# Patient Record
Sex: Female | Born: 1965 | Race: White | Hispanic: No | Marital: Married | Smoking: Former smoker
Health system: Southern US, Community
[De-identification: ages and names within clinical notes are randomized; demographics above are authoritative.]

## PROBLEM LIST (undated history)

## (undated) DIAGNOSIS — Z87442 Personal history of urinary calculi: Secondary | ICD-10-CM

## (undated) DIAGNOSIS — M1990 Unspecified osteoarthritis, unspecified site: Secondary | ICD-10-CM

## (undated) DIAGNOSIS — R011 Cardiac murmur, unspecified: Secondary | ICD-10-CM

## (undated) DIAGNOSIS — R51 Headache: Secondary | ICD-10-CM

## (undated) DIAGNOSIS — C801 Malignant (primary) neoplasm, unspecified: Secondary | ICD-10-CM

## (undated) DIAGNOSIS — R928 Other abnormal and inconclusive findings on diagnostic imaging of breast: Secondary | ICD-10-CM

## (undated) DIAGNOSIS — Q279 Congenital malformation of peripheral vascular system, unspecified: Secondary | ICD-10-CM

## (undated) HISTORY — PX: BREAST BIOPSY: SHX20

---

## 2016-10-09 ENCOUNTER — Other Ambulatory Visit: Payer: Self-pay | Admitting: Surgery

## 2016-10-09 DIAGNOSIS — N6489 Other specified disorders of breast: Secondary | ICD-10-CM

## 2016-10-11 ENCOUNTER — Ambulatory Visit
Admission: RE | Admit: 2016-10-11 | Discharge: 2016-10-11 | Disposition: A | Discharge status: Home / Self Care | Payer: BLUE CROSS/BLUE SHIELD | Source: Ambulatory Visit | Attending: Surgery | Admitting: Surgery

## 2016-10-11 DIAGNOSIS — N6489 Other specified disorders of breast: Secondary | ICD-10-CM

## 2016-10-24 ENCOUNTER — Other Ambulatory Visit: Payer: Self-pay | Admitting: General Surgery

## 2016-10-24 DIAGNOSIS — R928 Other abnormal and inconclusive findings on diagnostic imaging of breast: Secondary | ICD-10-CM

## 2016-11-01 ENCOUNTER — Other Ambulatory Visit: Payer: Self-pay | Admitting: General Surgery

## 2016-11-01 DIAGNOSIS — R928 Other abnormal and inconclusive findings on diagnostic imaging of breast: Secondary | ICD-10-CM

## 2016-11-02 ENCOUNTER — Encounter (HOSPITAL_COMMUNITY)
Admission: RE | Admit: 2016-11-02 | Discharge: 2016-11-02 | Disposition: A | Discharge status: Home / Self Care | Payer: BLUE CROSS/BLUE SHIELD | Source: Ambulatory Visit | Attending: General Surgery | Admitting: General Surgery

## 2016-11-02 ENCOUNTER — Encounter (HOSPITAL_COMMUNITY): Payer: Self-pay

## 2016-11-02 DIAGNOSIS — R928 Other abnormal and inconclusive findings on diagnostic imaging of breast: Secondary | ICD-10-CM | POA: Diagnosis present

## 2016-11-02 DIAGNOSIS — N6092 Unspecified benign mammary dysplasia of left breast: Secondary | ICD-10-CM | POA: Diagnosis not present

## 2016-11-02 DIAGNOSIS — R519 Headache, unspecified: Secondary | ICD-10-CM

## 2016-11-02 DIAGNOSIS — Z01812 Encounter for preprocedural laboratory examination: Secondary | ICD-10-CM | POA: Diagnosis present

## 2016-11-02 DIAGNOSIS — C801 Malignant (primary) neoplasm, unspecified: Secondary | ICD-10-CM

## 2016-11-02 DIAGNOSIS — R011 Cardiac murmur, unspecified: Secondary | ICD-10-CM

## 2016-11-02 DIAGNOSIS — R921 Mammographic calcification found on diagnostic imaging of breast: Secondary | ICD-10-CM | POA: Diagnosis not present

## 2016-11-02 DIAGNOSIS — Z87891 Personal history of nicotine dependence: Secondary | ICD-10-CM | POA: Diagnosis not present

## 2016-11-02 DIAGNOSIS — Z79899 Other long term (current) drug therapy: Secondary | ICD-10-CM | POA: Diagnosis not present

## 2016-11-02 HISTORY — DX: Malignant (primary) neoplasm, unspecified: C80.1

## 2016-11-02 HISTORY — DX: Headache: R51

## 2016-11-02 HISTORY — DX: Headache, unspecified: R51.9

## 2016-11-02 HISTORY — DX: Cardiac murmur, unspecified: R01.1

## 2016-11-02 HISTORY — DX: Congenital malformation of peripheral vascular system, unspecified: Q27.9

## 2016-11-02 HISTORY — DX: Unspecified osteoarthritis, unspecified site: M19.90

## 2016-11-02 HISTORY — DX: Personal history of urinary calculi: Z87.442

## 2016-11-02 LAB — CBC WITH DIFFERENTIAL/PLATELET
BASOS PCT: 0 %
Basophils Absolute: 0 10*3/uL (ref 0.0–0.1)
EOS ABS: 0.1 10*3/uL (ref 0.0–0.7)
EOS PCT: 2 %
HCT: 43.1 % (ref 36.0–46.0)
Hemoglobin: 14.6 g/dL (ref 12.0–15.0)
Lymphocytes Relative: 25 %
Lymphs Abs: 1.5 10*3/uL (ref 0.7–4.0)
MCH: 31.5 pg (ref 26.0–34.0)
MCHC: 33.9 g/dL (ref 30.0–36.0)
MCV: 93.1 fL (ref 78.0–100.0)
MONO ABS: 0.6 10*3/uL (ref 0.1–1.0)
Monocytes Relative: 10 %
Neutro Abs: 3.9 10*3/uL (ref 1.7–7.7)
Neutrophils Relative %: 63 %
PLATELETS: 266 10*3/uL (ref 150–400)
RBC: 4.63 MIL/uL (ref 3.87–5.11)
RDW: 13.5 % (ref 11.5–15.5)
WBC: 6.2 10*3/uL (ref 4.0–10.5)

## 2016-11-02 LAB — COMPREHENSIVE METABOLIC PANEL
ALBUMIN: 4.3 g/dL (ref 3.5–5.0)
ALT: 20 U/L (ref 14–54)
AST: 23 U/L (ref 15–41)
Alkaline Phosphatase: 49 U/L (ref 38–126)
Anion gap: 11 (ref 5–15)
BUN: 13 mg/dL (ref 6–20)
CHLORIDE: 106 mmol/L (ref 101–111)
CO2: 23 mmol/L (ref 22–32)
Calcium: 9.7 mg/dL (ref 8.9–10.3)
Creatinine, Ser: 0.62 mg/dL (ref 0.44–1.00)
GFR calc Af Amer: >60 mL/min (ref >60)
Glucose, Bld: 86 mg/dL (ref 65–99)
POTASSIUM: 4.5 mmol/L (ref 3.5–5.1)
SODIUM: 140 mmol/L (ref 135–145)
Total Bilirubin: 0.6 mg/dL (ref 0.3–1.2)
Total Protein: 6.8 g/dL (ref 6.5–8.1)

## 2016-11-02 MED ORDER — CHLORHEXIDINE GLUCONATE CLOTH 2 % EX PADS: 6.0000 | MEDICATED_PAD | Freq: Once | CUTANEOUS | Status: DC

## 2016-11-02 NOTE — Pre-Procedure Instructions (Signed)
    [facility]  11/02/2016      [id number] [place], [place] [address] [place] [address] Phone: [phone] Fax: [phone]    Your procedure is scheduled on Tues. Mar. 27 @ 100.  Report to [facility] Admitting at 1100 A.M.  Call this number if you have problems the morning of surgery:  [phone]   Remember:  Do not eat food or drink liquids after midnight.  Take these medicines the morning of surgery with A SIP OF WATER (none).  Stop taking any Vitamins or Herbal Medications. No Goody's, BC's, Aleve, Advil, Motrin, Ibuprofen or Fish oil.   Do not wear jewelry, make-up or nail polish.  Do not wear lotions, powders, or perfumes, or deoderant.  Do not shave 48 hours prior to surgery.   Do not bring valuables to the hospital.  [facility] is not responsible for any belongings or valuables.  Contacts, dentures or bridgework may not be worn into surgery.  Leave your suitcase in the car.  After surgery it may be brought to your room.  For patients admitted to the hospital, discharge time will be determined by your treatment team.  Patients discharged the day of surgery will not be allowed to drive home.   Special instructions:  See attached  Please read over the following fact sheets that you were given. Pain Booklet, Coughing and Deep Breathing and Surgical Site Infection Prevention

## 2016-11-02 NOTE — Progress Notes (Signed)
PCP: [facility] in [place]  Cardiologist: pt denies  EKG: pt denies past year  Stress test:pt denies ever  ECHO: pt denies ever   Cardiac Cath: pt denies ever  Chest x-ray: pt denies past year

## 2016-11-05 NOTE — H&P (Signed)
Leslie Lynch Location: [facility] Patient #: [id number] DOB: Sep 17, 1965 Married / Language: English / Race: White Female        History of Present Illness       This is a very pleasant 51 year old female. She is here with her husband to discuss abnormal mammogram of the left breast. She is referred by Dr. Jimmye Norman at the breast center of [place].      She is from [place] She had a mammogram 10 years ago and decided it was time for another. Imaging was performed in [place]. They described as a localized area of architectural distortion in the left breast, 1 to 2 o'clock position, stated this was worrisome and BI-RADS Category 4. They attempted core biopsy but were unsuccessful in getting tissue. She saw a Psychologist, sport and exercise, Dr. Autumn Messing, who referred her to the breast center of [facility].      She states that her films were all reviewed at [facility].. Image guided biopsy was performed of the left breast upper outer quadrant where there some calcifications and distortion. Pathology report showed usual ductal hyperplasia and fibrocystic change. This was felt to be discordant. She was referred by Dr. Jimmye Norman to have this area excised.        Comorbidities are minimal. Never had any surgery. 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 History of kidney stones Family history is negative for breast cancer or ovarian cancer. A grandmother had uterine cancer. She lives in [place]. She is married. Her husband is here with her today. They have 2 adult children. Denies tobacco. Takes alcohol occasionally. She is a housewife.        We spent a long time going over everything. We reviewed her outside mammograms and I can see the images from [facility]. I told her that there was a low but definite risk about upgrade to atypia or in situ cancer. Probably 10% estimated. I advised conservative excisional biopsy with  radioactive seed. She agrees.       She'll be scheduled for left breast lumpectomy with radioactive seed localization.  Her breasts are small and were going to try to keep this conservative.  She she does not want an incision in the areolar margin because she is afraid of loss of erectile function and numbness of the nipple. I think the incision will ask to be higher in the upper outer quadrant       I discussed the indications, details, techniques, and numerous risk of the surgery with her. She is aware the risk of bleeding, infection, cosmetic deformity, reoperation of cancer, nerve damage with numbness or chronic pain, and other unforeseen problems. She understands these issues well. At this time all of her questions were answered. She agrees with this plan.   Past Surgical History  No pertinent past surgical history   Diagnostic Studies History  Colonoscopy  never Mammogram  within last year Pap Smear  1-5 years ago  Allergies  No Known Allergies   Medication History  Vitamin D3 (5000UNIT Tablet, Oral) Active. Biotin (Oral) Specific strength unknown - Active. Pregnenolone Micronized Active. Fish Oil (Oral) Specific strength unknown - Active. Medications Reconciled  Social History Alcohol use  Occasional alcohol use. Caffeine use  Coffee, Tea. No drug use  Tobacco use  Former smoker.  Family History  Arthritis  Mother. Diabetes Mellitus  Father. Migraine Headache  Mother. Thyroid problems  Mother.  Pregnancy / Birth History  Age at menarche  12 years. Age of menopause  7-55 Gravida  2 Irregular periods  Maternal age  31-25 Para  2  Other Problems  Other disease, cancer, significant illness     Review of Systems General Present- Fatigue, Night Sweats and Weight Gain. Not Present- Appetite Loss, Chills, Fever and Weight Loss. Skin Not Present- Change in Wart/Mole, Dryness, Hives, Jaundice, New Lesions, Non-Healing Wounds, Rash and  Ulcer. HEENT Present- Wears glasses/contact lenses. Not Present- Earache, Hearing Loss, Hoarseness, Nose Bleed, Oral Ulcers, Ringing in the Ears, Seasonal Allergies, Sinus Pain, Sore Throat, Visual Disturbances and Yellow Eyes. Respiratory Not Present- Bloody sputum, Chronic Cough, Difficulty Breathing, Snoring and Wheezing. Cardiovascular Not Present- Chest Pain, Difficulty Breathing Lying Down, Leg Cramps, Palpitations, Rapid Heart Rate, Shortness of Breath and Swelling of Extremities. Gastrointestinal Not Present- Abdominal Pain, Bloating, Bloody Stool, Change in Bowel Habits, Chronic diarrhea, Constipation, Difficulty Swallowing, Excessive gas, Gets full quickly at meals, Hemorrhoids, Indigestion, Nausea, Rectal Pain and Vomiting. Female Genitourinary Not Present- Frequency, Nocturia, Painful Urination, Pelvic Pain and Urgency. Musculoskeletal Not Present- Back Pain, Joint Pain, Joint Stiffness, Muscle Pain, Muscle Weakness and Swelling of Extremities. Neurological Not Present- Decreased Memory, Fainting, Headaches, Numbness, Seizures, Tingling, Tremor, Trouble walking and Weakness. Psychiatric Present- Change in Sleep Pattern. Not Present- Anxiety, Bipolar, Depression, Fearful and Frequent crying. Endocrine Present- Hair Changes and Hot flashes. Not Present- Cold Intolerance, Excessive Hunger, Heat Intolerance and New Diabetes. Hematology Not Present- Blood Thinners, Easy Bruising, Excessive bleeding, Gland problems, HIV and Persistent Infections.  Vitals  Weight: 121.6 lb Height: 65in Body Surface Area: 1.6 m Body Mass Index: 20.24 kg/m  Temp.: 98.58F  Pulse: 86 (Regular)  BP: 132/70 (Sitting, Left Arm, Standard)    Physical Exam General Mental Status-Alert. General Appearance-Consistent with stated age. Hydration-Well hydrated. Voice-Normal.  Head and Neck Head-normocephalic, atraumatic with no lesions or palpable masses. Trachea-midline. Thyroid Gland  Characteristics - normal size and consistency. Note: Direct exam of hard palate and soft palate are negative. The left peritonsillar area shows some purplish discoloration but no mass. The right peritonsillar area is normal.   Eye Eyeball - Bilateral-Extraocular movements intact. Sclera/Conjunctiva - Bilateral-No scleral icterus.  Chest and Lung Exam Chest and lung exam reveals -quiet, even and easy respiratory effort with no use of accessory muscles and on auscultation, normal breath sounds, no adventitious sounds and normal vocal resonance. Inspection Chest Wall - Normal. Back - normal.  Breast Breast - Left-Symmetric, Non Tender, No Biopsy scars, no Dimpling, No Inflammation, No Lumpectomy scars, No Mastectomy scars, No Peau d' Orange. Breast - Right-Symmetric, Non Tender, No Biopsy scars, no Dimpling, No Inflammation, No Lumpectomy scars, No Mastectomy scars, No Peau d' Orange. Breast Lump-No Palpable Breast Mass. Note: Breasts are small. Slight ecchymoses upper outer quadrant left breast. Maybe a little thickening for the biopsy. No mass in either breast. No other skin change. No axillary adenopathy.   Cardiovascular Cardiovascular examination reveals -normal heart sounds, regular rate and rhythm with no murmurs and normal pedal pulses bilaterally.  Abdomen Inspection Inspection of the abdomen reveals - No Hernias. Skin - Scar - no surgical scars. Palpation/Percussion Palpation and Percussion of the abdomen reveal - Soft, Non Tender, No Rebound tenderness, No Rigidity (guarding) and No hepatosplenomegaly. Auscultation Auscultation of the abdomen reveals - Bowel sounds normal.  Neurologic Neurologic evaluation reveals -alert and oriented x 3 with no impairment of recent or remote memory. Mental Status-Normal.  Musculoskeletal Normal Exam - Left-Upper Extremity Strength Normal and Lower Extremity Strength Normal. Normal Exam - Right-Upper  Extremity  Strength Normal and Lower Extremity Strength Normal.  Lymphatic Head & Neck  General Head & Neck Lymphatics: Bilateral - Description - Normal. Axillary  General Axillary Region: Bilateral - Description - Normal. Tenderness - Non Tender. Femoral & Inguinal  Generalized Femoral & Inguinal Lymphatics: Bilateral - Description - Normal. Tenderness - Non Tender.    Assessment & Plan  ABNORMAL MAMMOGRAM OF LEFT BREAST (R92.8)   Your recent x-rays and image guided biopsies show an area of distortion in the upper outer quadrant of the left breast. The radiologist felt that this area was suspicious The image guided biopsy shows fibrocystic change and usual ductal hyperplasia, which are low risk findings The radiologist was not comfortable with this and thought that this was discordant and recommended a biopsy I have reviewed the mammograms and I agree that a biopsy is indicated Most likely this is not cancer but there may be as much as 10% chance that this could be upgraded to atypical hyperplasia or even low-grade breast cancer  After lengthy discussion, we have decided to proceed with scheduling you for a left breast lumpectomy with radioactive seed localization I have discussed the indications, techniques, and risk of this surgery in detail    [facility]. Dalbert Batman, M.D., [facility] General and Minimally invasive Surgery Breast and Colorectal Surgery Office:   [phone] Pager:   [phone]

## 2016-11-06 ENCOUNTER — Ambulatory Visit
Admission: RE | Admit: 2016-11-06 | Discharge: 2016-11-06 | Disposition: A | Discharge status: Home / Self Care | Payer: BLUE CROSS/BLUE SHIELD | Source: Ambulatory Visit | Attending: General Surgery | Admitting: General Surgery

## 2016-11-06 DIAGNOSIS — R928 Other abnormal and inconclusive findings on diagnostic imaging of breast: Secondary | ICD-10-CM

## 2016-11-07 ENCOUNTER — Ambulatory Visit (HOSPITAL_COMMUNITY)
Admission: RE | Admit: 2016-11-07 | Discharge: 2016-11-07 | Disposition: A | Discharge status: Home / Self Care | Payer: BLUE CROSS/BLUE SHIELD | Source: Ambulatory Visit | Attending: General Surgery | Admitting: General Surgery

## 2016-11-07 ENCOUNTER — Ambulatory Visit
Admission: RE | Admit: 2016-11-07 | Discharge: 2016-11-07 | Disposition: A | Discharge status: Home / Self Care | Payer: BLUE CROSS/BLUE SHIELD | Source: Ambulatory Visit | Attending: General Surgery | Admitting: General Surgery

## 2016-11-07 ENCOUNTER — Encounter (HOSPITAL_COMMUNITY): Payer: Self-pay | Admitting: *Deleted

## 2016-11-07 ENCOUNTER — Ambulatory Visit (HOSPITAL_COMMUNITY): Payer: BLUE CROSS/BLUE SHIELD | Admitting: Certified Registered Nurse Anesthetist

## 2016-11-07 ENCOUNTER — Encounter (HOSPITAL_COMMUNITY)
Admission: RE | Disposition: A | Discharge status: Home / Self Care | Payer: Self-pay | Source: Ambulatory Visit | Attending: General Surgery

## 2016-11-07 DIAGNOSIS — R928 Other abnormal and inconclusive findings on diagnostic imaging of breast: Secondary | ICD-10-CM

## 2016-11-07 DIAGNOSIS — Z79899 Other long term (current) drug therapy: Secondary | ICD-10-CM | POA: Insufficient documentation

## 2016-11-07 DIAGNOSIS — N6092 Unspecified benign mammary dysplasia of left breast: Secondary | ICD-10-CM | POA: Diagnosis not present

## 2016-11-07 DIAGNOSIS — R921 Mammographic calcification found on diagnostic imaging of breast: Secondary | ICD-10-CM | POA: Insufficient documentation

## 2016-11-07 DIAGNOSIS — Z87891 Personal history of nicotine dependence: Secondary | ICD-10-CM | POA: Insufficient documentation

## 2016-11-07 HISTORY — DX: Other abnormal and inconclusive findings on diagnostic imaging of breast: R92.8

## 2016-11-07 HISTORY — PX: BREAST EXCISIONAL BIOPSY: SUR124

## 2016-11-07 HISTORY — PX: BREAST LUMPECTOMY WITH RADIOACTIVE SEED LOCALIZATION: SHX6424

## 2016-11-07 LAB — HCG, SERUM, QUALITATIVE: Preg, Serum: NEGATIVE

## 2016-11-07 SURGERY — BREAST LUMPECTOMY WITH RADIOACTIVE SEED LOCALIZATION
Anesthesia: Monitor Anesthesia Care | Site: Breast | Laterality: Left

## 2016-11-07 MED ORDER — LIDOCAINE-EPINEPHRINE 2 %-1:100000 IJ SOLN
INTRAMUSCULAR | Status: DC | PRN
  Administered 2016-11-07: 20 mL via INTRADERMAL

## 2016-11-07 MED ORDER — FENTANYL CITRATE (PF) 100 MCG/2ML IJ SOLN
INTRAMUSCULAR | Status: DC | PRN
  Administered 2016-11-07: 25 ug via INTRAVENOUS
  Administered 2016-11-07: 50 ug via INTRAVENOUS
  Administered 2016-11-07: 25 ug via INTRAVENOUS

## 2016-11-07 MED ORDER — HYDROCODONE-ACETAMINOPHEN 5-325 MG PO TABS: 1.0000 | ORAL_TABLET | Freq: Four times a day (QID) | ORAL | 0 refills | Status: AC | PRN

## 2016-11-07 MED ORDER — OXYCODONE HCL 5 MG PO TABS: 5.0000 mg | ORAL_TABLET | Freq: Once | ORAL | Status: DC | PRN

## 2016-11-07 MED ORDER — LACTATED RINGERS IV SOLN
INTRAVENOUS | Status: DC | PRN
  Administered 2016-11-07: 14:00:00 via INTRAVENOUS

## 2016-11-07 MED ORDER — PROPOFOL 10 MG/ML IV BOLUS
INTRAVENOUS | Status: AC
  Filled 2016-11-07: qty 20

## 2016-11-07 MED ORDER — ACETAMINOPHEN 500 MG PO TABS
1000.0000 mg | ORAL_TABLET | ORAL | Status: DC
  Filled 2016-11-07: qty 2

## 2016-11-07 MED ORDER — GABAPENTIN 300 MG PO CAPS
300.0000 mg | ORAL_CAPSULE | ORAL | Status: AC
  Administered 2016-11-07: 300 mg via ORAL
  Filled 2016-11-07: qty 1

## 2016-11-07 MED ORDER — EPHEDRINE SULFATE 50 MG/ML IJ SOLN
INTRAMUSCULAR | Status: DC | PRN
  Administered 2016-11-07: 10 mg via INTRAVENOUS

## 2016-11-07 MED ORDER — PROPOFOL 10 MG/ML IV BOLUS
INTRAVENOUS | Status: DC | PRN
  Administered 2016-11-07 (×3): 20 mg via INTRAVENOUS

## 2016-11-07 MED ORDER — PHENYLEPHRINE HCL 10 MG/ML IJ SOLN
INTRAMUSCULAR | Status: DC | PRN
  Administered 2016-11-07: 120 ug via INTRAVENOUS
  Administered 2016-11-07 (×2): 80 ug via INTRAVENOUS

## 2016-11-07 MED ORDER — MIDAZOLAM HCL 2 MG/2ML IJ SOLN
INTRAMUSCULAR | Status: AC
  Filled 2016-11-07: qty 2

## 2016-11-07 MED ORDER — FENTANYL CITRATE (PF) 100 MCG/2ML IJ SOLN: 25.0000 ug | INTRAMUSCULAR | Status: DC | PRN

## 2016-11-07 MED ORDER — PROPOFOL 500 MG/50ML IV EMUL
INTRAVENOUS | Status: DC | PRN
  Administered 2016-11-07: 100 ug/kg/min via INTRAVENOUS

## 2016-11-07 MED ORDER — MIDAZOLAM HCL 5 MG/5ML IJ SOLN
INTRAMUSCULAR | Status: DC | PRN
  Administered 2016-11-07: 2 mg via INTRAVENOUS

## 2016-11-07 MED ORDER — OXYCODONE HCL 5 MG/5ML PO SOLN: 5.0000 mg | Freq: Once | ORAL | Status: DC | PRN

## 2016-11-07 MED ORDER — FENTANYL CITRATE (PF) 100 MCG/2ML IJ SOLN
INTRAMUSCULAR | Status: AC
  Filled 2016-11-07: qty 2

## 2016-11-07 MED ORDER — BUPIVACAINE HCL 0.25 % IJ SOLN
INTRAMUSCULAR | Status: DC | PRN
  Administered 2016-11-07: 30 mL

## 2016-11-07 MED ORDER — BUPIVACAINE HCL (PF) 0.25 % IJ SOLN
INTRAMUSCULAR | Status: AC
  Filled 2016-11-07: qty 30

## 2016-11-07 MED ORDER — CELECOXIB 200 MG PO CAPS
400.0000 mg | ORAL_CAPSULE | ORAL | Status: AC
  Administered 2016-11-07: 400 mg via ORAL
  Filled 2016-11-07: qty 2

## 2016-11-07 MED ORDER — ONDANSETRON HCL 4 MG/2ML IJ SOLN
INTRAMUSCULAR | Status: DC | PRN
  Administered 2016-11-07: 4 mg via INTRAVENOUS

## 2016-11-07 MED ORDER — 0.9 % SODIUM CHLORIDE (POUR BTL) OPTIME
TOPICAL | Status: DC | PRN
  Administered 2016-11-07: 1000 mL

## 2016-11-07 MED ORDER — LIDOCAINE-EPINEPHRINE 2 %-1:100000 IJ SOLN
INTRAMUSCULAR | Status: AC
Start: 2016-11-07 — End: 2016-11-07
  Filled 2016-11-07: qty 1

## 2016-11-07 MED ORDER — CEFAZOLIN SODIUM-DEXTROSE 2-4 GM/100ML-% IV SOLN
2.0000 g | INTRAVENOUS | Status: AC
  Administered 2016-11-07: 2 g via INTRAVENOUS
  Filled 2016-11-07: qty 100

## 2016-11-07 SURGICAL SUPPLY — 42 items
APPLIER CLIP 9.375 MED OPEN (MISCELLANEOUS) ×2
BINDER BREAST LRG (GAUZE/BANDAGES/DRESSINGS) IMPLANT
BINDER BREAST XLRG (GAUZE/BANDAGES/DRESSINGS) IMPLANT
BLADE SURG 15 STRL LF DISP TIS (BLADE) ×1 IMPLANT
BLADE SURG 15 STRL SS (BLADE) ×1
CANISTER SUCT 3000ML PPV (MISCELLANEOUS) ×2 IMPLANT
CHLORAPREP W/TINT 26ML (MISCELLANEOUS) ×2 IMPLANT
CLIP APPLIE 9.375 MED OPEN (MISCELLANEOUS) ×1 IMPLANT
COVER PROBE W GEL 5X96 (DRAPES) ×2 IMPLANT
COVER SURGICAL LIGHT HANDLE (MISCELLANEOUS) ×2 IMPLANT
DERMABOND ADVANCED (GAUZE/BANDAGES/DRESSINGS) ×1
DERMABOND ADVANCED .7 DNX12 (GAUZE/BANDAGES/DRESSINGS) ×1 IMPLANT
DEVICE DUBIN SPECIMEN MAMMOGRA (MISCELLANEOUS) ×2 IMPLANT
DRAPE CHEST BREAST 15X10 FENES (DRAPES) ×2 IMPLANT
DRAPE UTILITY XL STRL (DRAPES) ×2 IMPLANT
DRSG PAD ABDOMINAL 8X10 ST (GAUZE/BANDAGES/DRESSINGS) ×2 IMPLANT
ELECT CAUTERY BLADE 6.4 (BLADE) ×2 IMPLANT
ELECT REM PT RETURN 9FT ADLT (ELECTROSURGICAL) ×2
ELECTRODE REM PT RTRN 9FT ADLT (ELECTROSURGICAL) ×1 IMPLANT
GLOVE EUDERMIC 7 POWDERFREE (GLOVE) ×4 IMPLANT
GOWN STRL REUS W/ TWL LRG LVL3 (GOWN DISPOSABLE) ×1 IMPLANT
GOWN STRL REUS W/ TWL XL LVL3 (GOWN DISPOSABLE) ×1 IMPLANT
GOWN STRL REUS W/TWL LRG LVL3 (GOWN DISPOSABLE) ×1
GOWN STRL REUS W/TWL XL LVL3 (GOWN DISPOSABLE) ×1
ILLUMINATOR WAVEGUIDE N/F (MISCELLANEOUS) IMPLANT
KIT BASIN OR (CUSTOM PROCEDURE TRAY) ×2 IMPLANT
KIT MARKER MARGIN INK (KITS) ×2 IMPLANT
LIGHT WAVEGUIDE WIDE FLAT (MISCELLANEOUS) IMPLANT
NEEDLE HYPO 25GX1X1/2 BEV (NEEDLE) ×2 IMPLANT
NS IRRIG 1000ML POUR BTL (IV SOLUTION) ×2 IMPLANT
PACK SURGICAL SETUP 50X90 (CUSTOM PROCEDURE TRAY) ×2 IMPLANT
PENCIL BUTTON HOLSTER BLD 10FT (ELECTRODE) ×2 IMPLANT
SPONGE LAP 4X18 X RAY DECT (DISPOSABLE) ×2 IMPLANT
SUT MNCRL AB 4-0 PS2 18 (SUTURE) ×2 IMPLANT
SUT SILK 2 0 SH (SUTURE) ×2 IMPLANT
SUT VIC AB 3-0 SH 18 (SUTURE) ×2 IMPLANT
SYR BULB 3OZ (MISCELLANEOUS) ×2 IMPLANT
SYR CONTROL 10ML LL (SYRINGE) ×2 IMPLANT
TOWEL OR 17X24 6PK STRL BLUE (TOWEL DISPOSABLE) ×2 IMPLANT
TOWEL OR 17X26 10 PK STRL BLUE (TOWEL DISPOSABLE) ×2 IMPLANT
TUBE CONNECTING 12X1/4 (SUCTIONS) ×2 IMPLANT
YANKAUER SUCT BULB TIP NO VENT (SUCTIONS) ×2 IMPLANT

## 2016-11-07 NOTE — Transfer of Care (Signed)
Immediate Anesthesia Transfer of Care Note  Patient: Leslie Lynch  Procedure(s) Performed: Procedure(s): RADIOACTIVE SEED GUIDED LEFT BREAST LUMPECTOMY (Left)  Patient Location: PACU  Anesthesia Type:MAC  Level of Consciousness: awake, alert  and oriented  Airway & Oxygen Therapy: Patient Spontanous Breathing  Post-op Assessment: Report given to RN and Post -op Vital signs reviewed and stable  Post vital signs: Reviewed and stable  Last Vitals:  Vitals:   11/07/16 1111 11/07/16 1441  BP: 122/72   Pulse: 78   Resp: 20   Temp: 37.1 C 36.5 C    Last Pain:  Vitals:   11/07/16 1111  TempSrc: Oral         Complications: No apparent anesthesia complications

## 2016-11-07 NOTE — Interval H&P Note (Signed)
History and Physical Interval Note:  11/07/2016 11:07 AM  Leslie Lynch  has presented today for surgery, with the diagnosis of LEFT BREAST ABNORMAL MAMMOGRAM  The various methods of treatment have been discussed with the patient and family. After consideration of risks, benefits and other options for treatment, the patient has consented to  Procedure(s): LEFT BREAST LUMPECTOMY WITH RADIOACTIVE SEED LOCALIZATION (Left) as a surgical intervention .  The patient's history has been reviewed, patient examined, no change in status, stable for surgery.  I have reviewed the patient's chart and labs.  Questions were answered to the patient's satisfaction.     [name]

## 2016-11-07 NOTE — Anesthesia Preprocedure Evaluation (Signed)
Anesthesia Evaluation  Patient identified by MRN, date of birth, ID band Patient awake    Reviewed: Allergy & Precautions, NPO status , Patient's Chart, lab work & pertinent test results  History of Anesthesia Complications Negative for: history of anesthetic complications  Airway Mallampati: I  TM Distance: >3 FB Neck ROM: Full    Dental  (+) Teeth Intact   Pulmonary neg shortness of breath, neg sleep apnea, neg COPD, neg recent URI, former smoker,    breath sounds clear to auscultation       Cardiovascular negative cardio ROS   Rhythm:Regular     Neuro/Psych  Headaches, negative psych ROS   GI/Hepatic negative GI ROS, Neg liver ROS,   Endo/Other    Renal/GU negative Renal ROS     Musculoskeletal  (+) Arthritis ,   Abdominal   Peds  Hematology negative hematology ROS (+)   Anesthesia Other Findings   Reproductive/Obstetrics                             Anesthesia Physical Anesthesia Plan  ASA: II  Anesthesia Plan: MAC   Post-op Pain Management:    Induction: Intravenous  Airway Management Planned: Natural Airway, Nasal Cannula, Simple Face Mask and Mask  Additional Equipment: None  Intra-op Plan:   Post-operative Plan: Extubation in OR  Informed Consent:   Dental advisory given  Plan Discussed with: CRNA and Surgeon  Anesthesia Plan Comments:         Anesthesia Quick Evaluation

## 2016-11-07 NOTE — Discharge Instructions (Signed)
[facility] °Office Phone Number [phone] ° °BREAST BIOPSY/ PARTIAL MASTECTOMY: POST OP INSTRUCTIONS ° °Always review your discharge instruction sheet given to you by the facility where your surgery was performed. ° °IF YOU HAVE DISABILITY OR FAMILY LEAVE FORMS, YOU MUST BRING THEM TO THE OFFICE FOR PROCESSING.  DO NOT GIVE THEM TO YOUR DOCTOR. ° °1. A prescription for pain medication may be given to you upon discharge.  Take your pain medication as prescribed, if needed.  If narcotic pain medicine is not needed, then you may take acetaminophen (Tylenol) or ibuprofen (Advil) as needed. °2. Take your usually prescribed medications unless otherwise directed °3. If you need a refill on your pain medication, please contact your pharmacy.  They will contact our office to request authorization.  Prescriptions will not be filled after 5pm or on week-ends. °4. You should eat very light the first 24 hours after surgery, such as soup, crackers, pudding, etc.  Resume your normal diet the day after surgery. °5. Most patients will experience some swelling and bruising in the breast.  Ice packs and a good support bra will help.  Swelling and bruising can take several days to resolve.  °6. It is common to experience some constipation if taking pain medication after surgery.  Increasing fluid intake and taking a stool softener will usually help or prevent this problem from occurring.  A mild laxative (Milk of Magnesia or Miralax) should be taken according to package directions if there are no bowel movements after 48 hours. °7. Unless discharge instructions indicate otherwise, you may remove your bandages 24-48 hours after surgery, and you may shower at that time.  You may have steri-strips (small skin tapes) in place directly over the incision.  These strips should be left on the skin for 7-10 days.  If your surgeon used skin glue on the incision, you may shower in 24 hours.  The glue will flake off over the  next 2-3 weeks.  Any sutures or staples will be removed at the office during your follow-up visit. °8. ACTIVITIES:  You may resume regular daily activities (gradually increasing) beginning the next day.  Wearing a good support bra or sports bra minimizes pain and swelling.  You may have sexual intercourse when it is comfortable. °a. You may drive when you no longer are taking prescription pain medication, you can comfortably wear a seatbelt, and you can safely maneuver your car and apply brakes. °b. RETURN TO WORK:  ______________________________________________________________________________________ °9. You should see your doctor in the office for a follow-up appointment approximately two weeks after your surgery.  Your doctor’s nurse will typically make your follow-up appointment when she calls you with your pathology report.  Expect your pathology report 2-3 business days after your surgery.  You may call to check if you do not hear from us after three days. °10. OTHER INSTRUCTIONS: _______________________________________________________________________________________________ _____________________________________________________________________________________________________________________________________ °_____________________________________________________________________________________________________________________________________ °_____________________________________________________________________________________________________________________________________ ° °WHEN TO CALL YOUR DOCTOR: °1. Fever over 101.0 °2. Nausea and/or vomiting. °3. Extreme swelling or bruising. °4. Continued bleeding from incision. °5. Increased pain, redness, or drainage from the incision. ° °The clinic staff is available to answer your questions during regular business hours.  Please don’t hesitate to call and ask to speak to one of the nurses for clinical concerns.  If you have a medical emergency, go to the nearest  emergency room or call 911.  A surgeon from [facility] is always on call at the hospital. ° °For further questions, please visit [email or web address]  °

## 2016-11-07 NOTE — Op Note (Signed)
Patient Name:           Leslie Lynch   Date of Surgery:        11/07/2016  Pre op Diagnosis:      Abnormal mammogram left breast   Post op Diagnosis:    Same  Procedure:                 Left breast lumpectomy with radioactive seed localization  Surgeon:                     Edsel Petrin. Dalbert Batman, M.D., FACS  Assistant:                      OR staff   Indication for Assistant: n/a  Operative Indications:     This is a very pleasant 51 year old female who  is here with her husband to discuss abnormal mammogram of the left breast. She is referred by Dr. Jimmye Norman at the breast center of [place].      She is from [place] She had a mammogram 10 years ago and decided it was time for another. Imaging was performed in [place]. They described as a localized area of architectural distortion in the left breast, 1 to 2 o'clock position, stated this was worrisome and BI-RADS Category 4. They attempted core biopsy but were unsuccessful in getting tissue. She saw a Psychologist, sport and exercise, Dr. Autumn Messing, who referred her to the breast center of [facility].      She states that her films were all reviewed at [facility].. Image guided biopsy was performed of the left breast upper outer quadrant where there some calcifications and distortion. Pathology report showed usual ductal hyperplasia and fibrocystic change. This was felt to be discordant. She was referred by Dr. Jimmye Norman to have this area excised.        We spent a long time going over everything. We reviewed her outside mammograms and I can see the images from [facility]. I told her that there was a low but definite risk about upgrade to atypia or in situ cancer. Probably 10% estimated. I advised conservative excisional biopsy with radioactive seed. She agrees.       She'll be scheduled for left breast lumpectomy with radioactive seed localization.   She agrees with this plan.  Operative Findings:       I was able to hear the audible signal the radioactive  seed using the neoprobe in the holding area preop.  The specimen mammogram looked good with radioactive seed and the original titanium marker clip in the center of the specimen.  Procedure in Detail:          The patient was brought to the operating room.  She was monitored and sedated by the anesthesia department.  The left breast was prepped and draped in a sterile fashion.  Surgical timeout was performed.  Intravenous antibiotics were given.  1% Xylocaine with epinephrine was used as a local infiltration anesthetic this was supplement with 0.5% Marcaine.     Using the neoprobe identified the area of maximum radioactivity high in the upper outer quadrant.  Curvilinear incision was made in this location, in general paralleling the areolar margin.  Using the neoprobe I performed the lumpectomy using electrocautery.  The excision was conservative.  The specimen was removed and marked with silk sutures and a 6 color ink kit to orient the pathologist.  The specimen mammogram looked good.  The specimen was marked and sent to the lab.  Hemostasis was excellent.  The wound was irrigated.  A metallic marker clip was placed in the lumpectomy cavity per protocol.  The breast tissues were reapproximated with interrupted sutures of 3-0 Vicryl sliding the tissues together to avoid defect.  Skin was closed with a running subcuticular 4-0 Monocryl and Dermabond.  Clean bandages and a breast binder were placed.  The patient tolerated the procedure well was taken to PACU in stable condition.  EBL 10 mL.  Counts correct.  Complications  none.     Edsel Petrin. Dalbert Batman, M.D., FACS General and Minimally Invasive Surgery Breast and Colorectal Surgery  11/07/2016 2:36 PM

## 2016-11-08 ENCOUNTER — Encounter (HOSPITAL_COMMUNITY): Payer: Self-pay | Admitting: General Surgery

## 2016-11-09 NOTE — Progress Notes (Signed)
Inform patient of Pathology report,.pathology shows benign radial scar.  No atypia.  No malignancy.  This is good news.  I will discuss with her in detail at next office visit.  hmi

## 2016-11-10 NOTE — Anesthesia Postprocedure Evaluation (Addendum)
Anesthesia Post Note  Patient: Leslie Lynch  Procedure(s) Performed: Procedure(s) (LRB): RADIOACTIVE SEED GUIDED LEFT BREAST LUMPECTOMY (Left)  Patient location during evaluation: PACU Anesthesia Type: MAC Level of consciousness: awake and alert Pain management: pain level controlled Vital Signs Assessment: post-procedure vital signs reviewed and stable Respiratory status: spontaneous breathing, nonlabored ventilation, respiratory function stable and patient connected to nasal cannula oxygen Cardiovascular status: stable and blood pressure returned to baseline Anesthetic complications: no       Last Vitals:  Vitals:   11/07/16 1515 11/07/16 1530  BP:  108/70  Pulse: 69 70  Resp: 17 18  Temp:  36.7 C    Last Pain:  Vitals:   11/07/16 1530  TempSrc:   PainSc: 0-No pain                 Khylon Davies

## 2017-01-15 NOTE — Addendum Note (Signed)
Addendum  created 01/15/17 1251 by Oleta Mouse, MD   Sign clinical note

## 2020-08-31 ENCOUNTER — Other Ambulatory Visit: Payer: Self-pay | Admitting: Family

## 2020-08-31 DIAGNOSIS — Z0000 Encounter for general adult medical examination without abnormal findings: Secondary | ICD-10-CM

## 2021-04-29 ENCOUNTER — Ambulatory Visit
Admission: RE | Admit: 2021-04-29 | Discharge: 2021-04-29 | Disposition: A | Discharge status: Home / Self Care | Payer: BLUE CROSS/BLUE SHIELD | Source: Ambulatory Visit | Attending: Family | Admitting: Family

## 2021-04-29 ENCOUNTER — Other Ambulatory Visit: Payer: Self-pay

## 2021-04-29 DIAGNOSIS — Z0000 Encounter for general adult medical examination without abnormal findings: Secondary | ICD-10-CM

## 2021-05-23 ENCOUNTER — Other Ambulatory Visit: Payer: Self-pay | Admitting: Family

## 2021-05-23 DIAGNOSIS — Z1231 Encounter for screening mammogram for malignant neoplasm of breast: Secondary | ICD-10-CM

## 2022-05-05 ENCOUNTER — Encounter: Payer: Self-pay | Admitting: Radiology

## 2022-05-05 ENCOUNTER — Ambulatory Visit
Admission: RE | Admit: 2022-05-05 | Discharge: 2022-05-05 | Disposition: A | Discharge status: Home / Self Care | Payer: Managed Care, Other (non HMO) | Source: Ambulatory Visit | Attending: Family | Admitting: Family

## 2022-05-05 DIAGNOSIS — Z1231 Encounter for screening mammogram for malignant neoplasm of breast: Secondary | ICD-10-CM

## 2022-05-15 ENCOUNTER — Other Ambulatory Visit: Payer: Self-pay | Admitting: Family

## 2022-05-15 DIAGNOSIS — Z1231 Encounter for screening mammogram for malignant neoplasm of breast: Secondary | ICD-10-CM

## 2023-04-08 IMAGING — MG MM DIGITAL SCREENING BILAT W/ TOMO AND CAD
6 of 10 series · 6 of 30 positions shown · non-contrast
Comparison: Previous exam(s).

CLINICAL DATA: Screening.

EXAM:
DIGITAL SCREENING BILATERAL MAMMOGRAM WITH TOMOSYNTHESIS AND CAD
TECHNIQUE: Bilateral screening digital craniocaudal and mediolateral oblique
mammograms were obtained. Bilateral screening digital breast
tomosynthesis was performed. The images were evaluated with
computer-aided detection.

[L XCCL synth-2D]
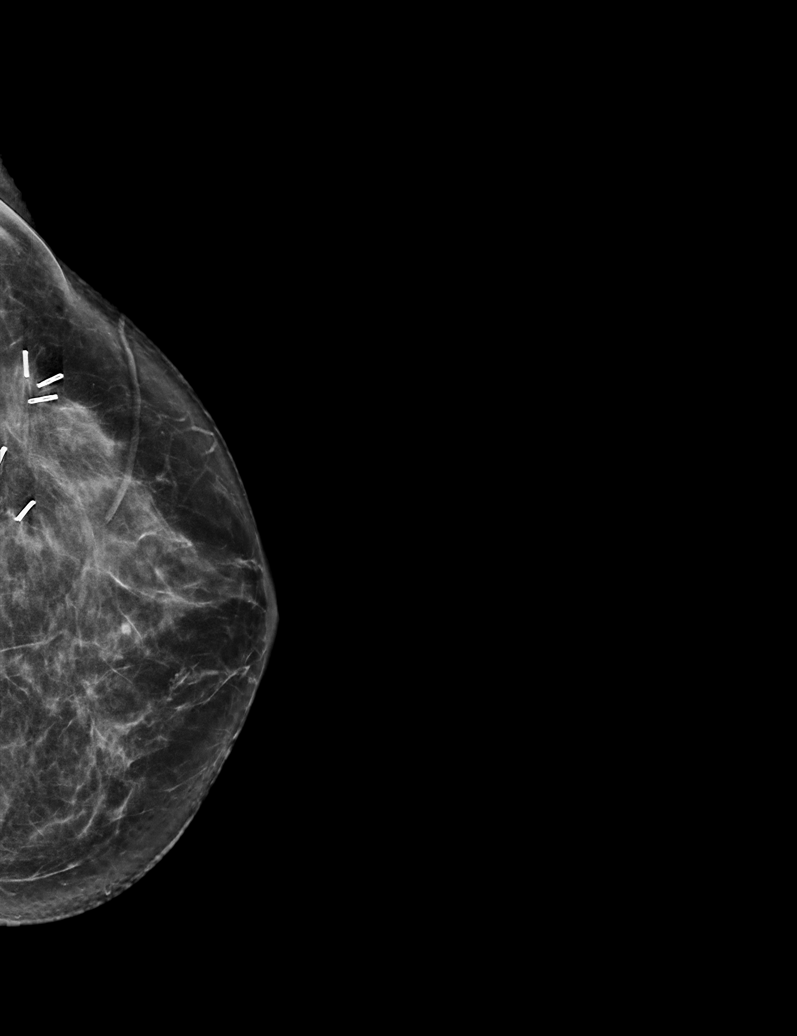

[R CC synth-2D]
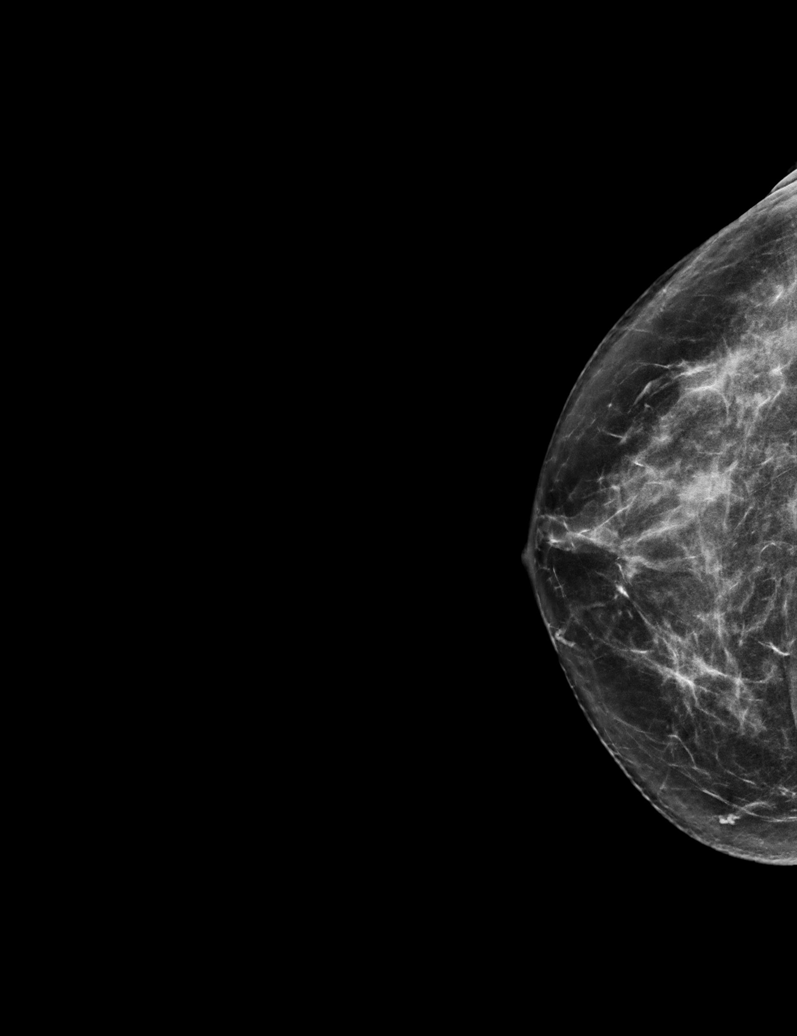

[L CC synth-2D]
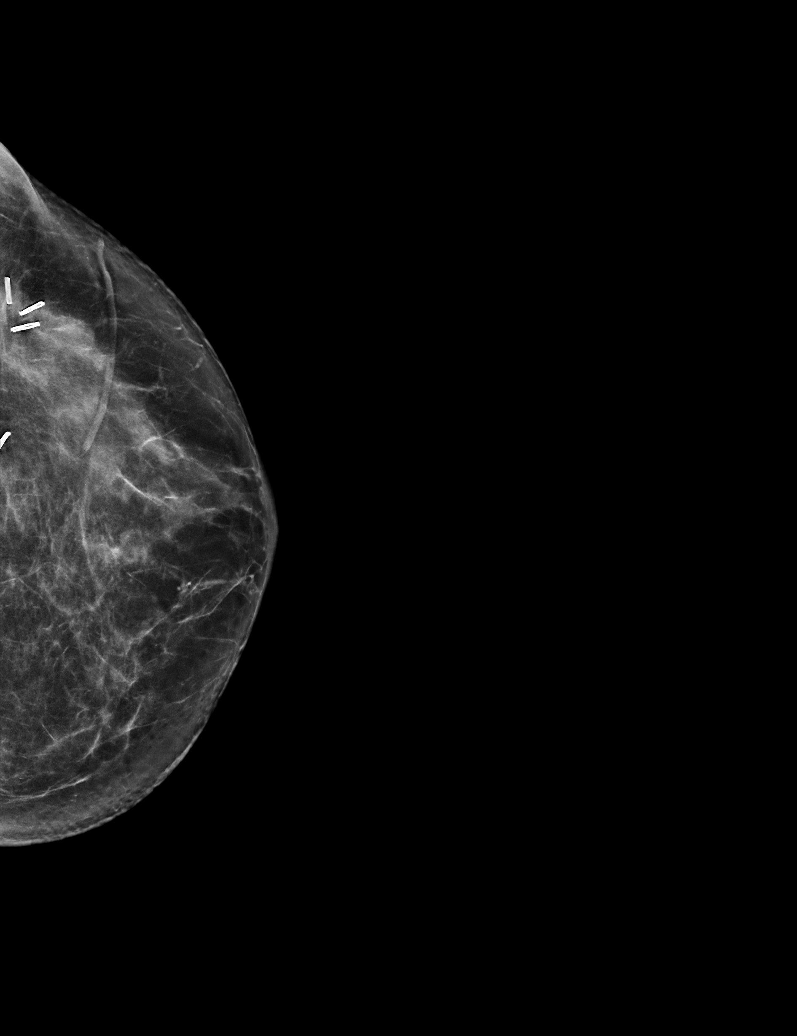

[R MLO synth-2D]
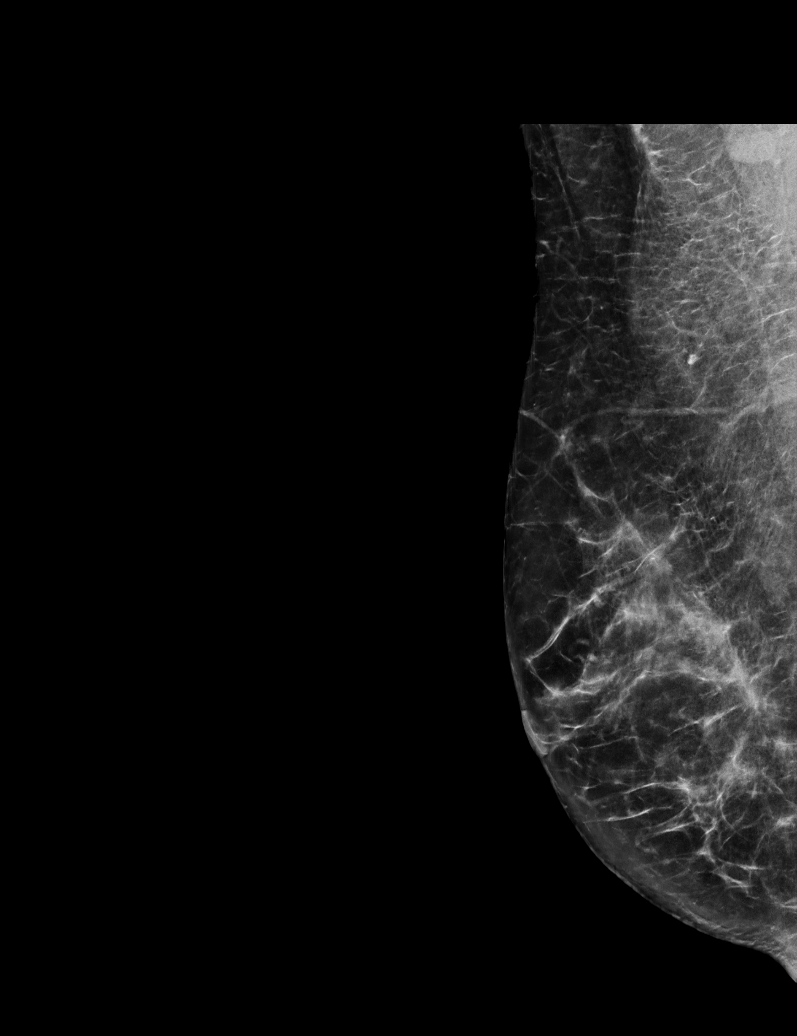

[L MLO synth-2D]
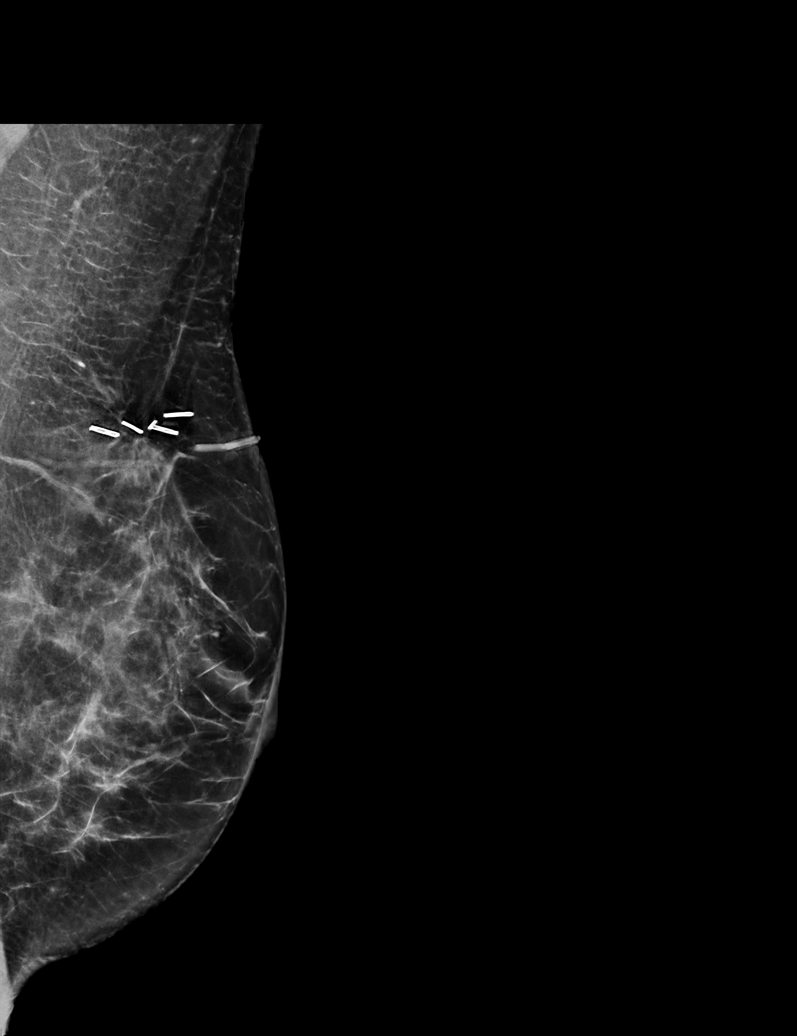

[R CC tomo · tomo slice 32/63.0]
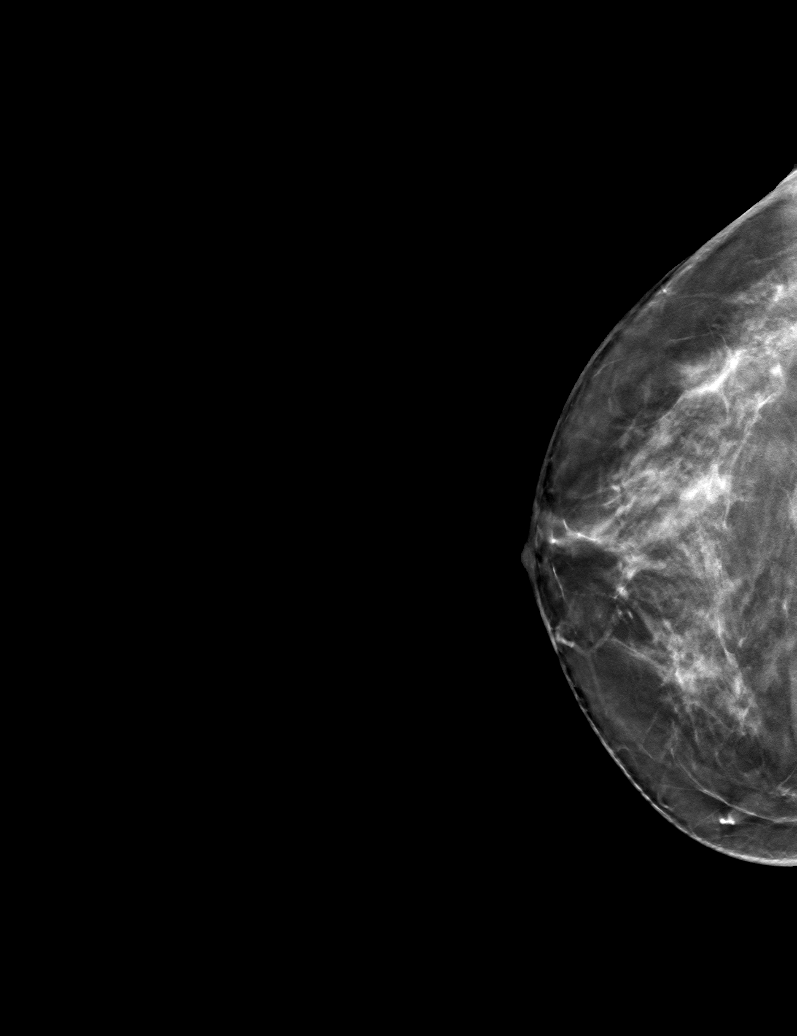

[6 of 30 positions shown; findings below may reference images not displayed]

ACR Breast Density Category c: The breast tissue is heterogeneously
dense, which may obscure small masses.
FINDINGS: There are no findings suspicious for malignancy.
IMPRESSION: No mammographic evidence of malignancy. A result letter of this
screening mammogram will be mailed directly to the patient.

RECOMMENDATION:
Screening mammogram in one year. (Code:Q3-W-BC3)

BI-RADS CATEGORY  1: Negative.

## 2023-05-11 ENCOUNTER — Ambulatory Visit: Payer: Managed Care, Other (non HMO)

## 2023-05-11 ENCOUNTER — Other Ambulatory Visit: Payer: Self-pay | Admitting: Family

## 2023-05-11 DIAGNOSIS — Z1231 Encounter for screening mammogram for malignant neoplasm of breast: Secondary | ICD-10-CM

## 2023-06-12 ENCOUNTER — Ambulatory Visit
Admission: RE | Admit: 2023-06-12 | Discharge: 2023-06-12 | Disposition: A | Discharge status: Home / Self Care | Payer: Managed Care, Other (non HMO) | Source: Ambulatory Visit | Attending: Family

## 2023-06-12 DIAGNOSIS — Z1231 Encounter for screening mammogram for malignant neoplasm of breast: Secondary | ICD-10-CM

## 2023-06-22 ENCOUNTER — Other Ambulatory Visit: Payer: Self-pay | Admitting: Family

## 2023-06-22 DIAGNOSIS — Z1231 Encounter for screening mammogram for malignant neoplasm of breast: Secondary | ICD-10-CM

## 2024-06-20 ENCOUNTER — Ambulatory Visit
Admission: RE | Admit: 2024-06-20 | Discharge: 2024-06-20 | Disposition: A | Discharge status: Home / Self Care | Payer: Managed Care, Other (non HMO) | Source: Ambulatory Visit | Attending: Family | Admitting: Family

## 2024-06-20 DIAGNOSIS — Z1231 Encounter for screening mammogram for malignant neoplasm of breast: Secondary | ICD-10-CM

## 2024-06-30 ENCOUNTER — Other Ambulatory Visit: Payer: Self-pay | Admitting: Family

## 2024-06-30 DIAGNOSIS — Z1231 Encounter for screening mammogram for malignant neoplasm of breast: Secondary | ICD-10-CM

## 2025-06-23 ENCOUNTER — Ambulatory Visit
